# Patient Record
Sex: Female | Born: 1950 | Race: Black or African American | Hispanic: No | Marital: Single | Smoking: Never smoker
Health system: Southern US, Community
[De-identification: ages and names within clinical notes are randomized; demographics above are authoritative.]

## PROBLEM LIST (undated history)

## (undated) DIAGNOSIS — C801 Malignant (primary) neoplasm, unspecified: Secondary | ICD-10-CM

## (undated) DIAGNOSIS — I10 Essential (primary) hypertension: Secondary | ICD-10-CM

## (undated) HISTORY — PX: TUBAL LIGATION: SHX77

## (undated) HISTORY — PX: EYE SURGERY: SHX253

## (undated) HISTORY — PX: ABDOMINAL HYSTERECTOMY: SHX81

---

## 2009-04-02 ENCOUNTER — Emergency Department: Payer: Self-pay | Admitting: Emergency Medicine

## 2010-04-04 ENCOUNTER — Emergency Department: Payer: Self-pay | Admitting: Emergency Medicine

## 2012-08-09 ENCOUNTER — Ambulatory Visit: Payer: Self-pay | Admitting: Family Medicine

## 2013-08-14 ENCOUNTER — Ambulatory Visit: Payer: Self-pay | Admitting: Neurology

## 2013-12-24 ENCOUNTER — Encounter (INDEPENDENT_AMBULATORY_CARE_PROVIDER_SITE_OTHER): Payer: BC Managed Care – PPO | Admitting: Ophthalmology

## 2013-12-24 DIAGNOSIS — H35033 Hypertensive retinopathy, bilateral: Secondary | ICD-10-CM

## 2013-12-24 DIAGNOSIS — H59033 Cystoid macular edema following cataract surgery, bilateral: Secondary | ICD-10-CM

## 2013-12-24 DIAGNOSIS — I10 Essential (primary) hypertension: Secondary | ICD-10-CM

## 2013-12-24 DIAGNOSIS — H43813 Vitreous degeneration, bilateral: Secondary | ICD-10-CM

## 2015-04-25 ENCOUNTER — Ambulatory Visit (INDEPENDENT_AMBULATORY_CARE_PROVIDER_SITE_OTHER): Payer: Managed Care, Other (non HMO)

## 2015-04-25 ENCOUNTER — Encounter: Payer: Self-pay | Admitting: Gynecology

## 2015-04-25 ENCOUNTER — Ambulatory Visit
Admission: EM | Admit: 2015-04-25 | Discharge: 2015-04-25 | Disposition: A | Discharge status: Home / Self Care | Payer: Managed Care, Other (non HMO) | Attending: Family Medicine | Admitting: Family Medicine

## 2015-04-25 DIAGNOSIS — M791 Myalgia, unspecified site: Secondary | ICD-10-CM

## 2015-04-25 DIAGNOSIS — N390 Urinary tract infection, site not specified: Secondary | ICD-10-CM

## 2015-04-25 HISTORY — DX: Essential (primary) hypertension: I10

## 2015-04-25 HISTORY — DX: Malignant (primary) neoplasm, unspecified: C80.1

## 2015-04-25 LAB — URINALYSIS COMPLETE WITH MICROSCOPIC (ARMC ONLY)
BILIRUBIN URINE: NEGATIVE
Glucose, UA: NEGATIVE mg/dL
Hgb urine dipstick: NEGATIVE
NITRITE: NEGATIVE
PH: 6.5 (ref 5.0–8.0)
Protein, ur: NEGATIVE mg/dL
Specific Gravity, Urine: 1.025 (ref 1.005–1.030)

## 2015-04-25 MED ORDER — NITROFURANTOIN MONOHYD MACRO 100 MG PO CAPS: 100.0000 mg | ORAL_CAPSULE | Freq: Two times a day (BID) | ORAL | Status: AC

## 2015-04-25 NOTE — ED Notes (Signed)
Patient c/o upper back pain at her shoulder blade. Patient clo UTI with sx of lower abdomen pain.

## 2015-04-25 NOTE — ED Provider Notes (Addendum)
CSN: [id number]     Arrival date & time 04/25/15  1429 History   First MD Initiated Contact with Patient 04/25/15 1546     Chief Complaint  Patient presents with  . Back Pain  . Urinary Tract Infection   (Consider location/radiation/quality/duration/timing/severity/associated sxs/prior Treatment) HPI: Patient presents today with symptoms of left scapular pain for the last 4 days. Patient admits that the symptoms are when she takes a deep breath. She denies any trauma or injury to the area. She denies any chest pain. She denies any shortness of breath, fever, headache, diaphoresis, nausea, vomiting, severe abdominal pain, flank pain. She also has had some urinary frequency and mild dysuria for the last day. She has a history of having a hysterectomy last year. Patient has history of thoracic compression fracture in the past. Patient admits that she has been wearing a spanx-like outfit to help with belly bulge that goes up to the breasts. She has slept in this a few times.  Past Medical History  Diagnosis Date  . Hypertension   . Cancer [facility])    Past Surgical History  Procedure Laterality Date  . Abdominal hysterectomy    . Eye surgery    . Tubal ligation     No family history on file. Social History  Substance Use Topics  . Smoking status: Never Smoker   . Smokeless tobacco: None  . Alcohol Use: Yes   OB History    No data available     Review of Systems: Negative except mentioned above.  Allergies  Review of patient's allergies indicates no known allergies.  Home Medications   Prior to Admission medications   Medication Sig Start Date End Date Taking? Authorizing Provider  amLODipine (NORVASC) 5 MG tablet Take 5 mg by mouth daily.   Yes Historical Provider, MD  cetirizine (ZYRTEC) 10 MG tablet Take 10 mg by mouth daily.   Yes Historical Provider, MD  hydrochlorothiazide (HYDRODIURIL) 25 MG tablet Take 25 mg by mouth daily.   Yes Historical Provider, MD  lisinopril  (PRINIVIL,ZESTRIL) 40 MG tablet Take 40 mg by mouth daily.   Yes Historical Provider, MD   Meds Ordered and Administered this Visit  Medications - No data to display  BP 153/94 mmHg  Pulse 67  Temp(Src) 98 F (36.7 C) (Oral)  Resp 16  Ht 5\' 3"  (1.6 m)  Wt 138 lb (62.596 kg)  BMI 24.45 kg/m2  SpO2 100% No data found.   Physical Exam   GENERAL: NAD HEENT: no pharyngeal erythema, no exudate, no erythema of TMs, no cervical LAD RESP: CTA B CARD: RRR MSK: mild tenderness to palpation along left scapula, no rash noted over area, FROM of back and neck ABD: +BS, NT, no flank tenderness NEURO: CN II-XII grossly intact   ED Course  Procedures (including critical care time)  Labs Review Labs Reviewed  URINALYSIS COMPLETEWITH MICROSCOPIC ([facility] ONLY) - Abnormal; Notable for the following:    Ketones, ur TRACE (*)    Leukocytes, UA TRACE (*)    Bacteria, UA FEW (*)    Squamous Epithelial / LPF 0-5 (*)    All other components within normal limits  URINE CULTURE    Imaging Review No results found.    MDM  A/P: UTI, Muscle Pain- Will treat patient for UTI with Macrobid. Send urine for culture. If symptoms persist or worsen I do recommend the patient seek medical attention. Patient's chest x-ray only shows old thoracic compression fracture. Patient can take over-the-counter pain  medication for symptoms. Would recommend that patient try not wearing the spanx-like outfit in case this is contributing to her symptoms. I'm not certain that her old thoracic compression fracture is contributing to her symptoms but if there is a discogenic cause an MRI would be helpful to get. She'll follow up with her primary care physician regarding this.  If symptoms do persist or worsen I do recommend that she follow up with her primary care physician for further workup and treatment.    Paulina Fusi, MD 04/25/15 [id number]  Paulina Fusi, MD 04/25/15 [phone]

## 2015-04-27 LAB — URINE CULTURE

## 2015-04-29 ENCOUNTER — Telehealth: Payer: Self-pay | Admitting: *Deleted

## 2015-04-29 NOTE — ED Notes (Signed)
Called patient and she reported that her initial symptoms had improved, but she now has a yeast infection. Prescription for diflucan called into pharmacy on record.

## 2015-05-24 ENCOUNTER — Inpatient Hospital Stay
Admit: 2015-05-24 | Discharge: 2015-05-24 | Disposition: A | Discharge status: Home / Self Care | Payer: PRIVATE HEALTH INSURANCE | Attending: Emergency Medicine

## 2015-05-24 DIAGNOSIS — S39012A Strain of muscle, fascia and tendon of lower back, initial encounter: Secondary | ICD-10-CM

## 2015-05-24 MED ORDER — METHOCARBAMOL 750 MG TAB
750 mg | ORAL_TABLET | Freq: Three times a day (TID) | ORAL | 0 refills | Status: AC
Start: 2015-05-24 — End: ?

## 2015-05-24 MED ORDER — KETOROLAC TROMETHAMINE 15 MG/ML INJECTION
15 mg/mL | INTRAMUSCULAR | Status: AC
Start: 2015-05-24 — End: 2015-05-24
  Administered 2015-05-24: 17:00:00 via INTRAMUSCULAR

## 2015-05-24 MED ORDER — NAPROXEN 500 MG TAB
500 mg | ORAL_TABLET | Freq: Two times a day (BID) | ORAL | 0 refills | Status: AC
Start: 2015-05-24 — End: 2015-06-03

## 2015-05-24 MED FILL — KETOROLAC TROMETHAMINE 15 MG/ML INJECTION: 15 mg/mL | INTRAMUSCULAR | Qty: 1

## 2015-05-24 NOTE — ED Notes (Signed)
Discharge instructions reviewed with the patient with opportunity for questions given.  The patient verbalized understanding. Patient armband removed and shredded. Patient in stable condition at time of discharge.

## 2015-05-24 NOTE — ED Notes (Signed)
This RN entered room to medicate pt. Pt states, "Before you medicate me, I need some answers! You all haven't done any X-Rays, my legs are still spasming, my face still tingles and I need an examination because I have to drive back to [place] and I could have nerve damage!" Bet[place], PA to bedside to speak with pt. Family at bedside. Pt updated on plan of care, verbalizes understanding.
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
## 2016-09-28 IMAGING — CR DG CHEST 2V
2 series · 2 of 2 positions shown · non-contrast
Comparison: 04/05/2010 portable AP view only

CLINICAL DATA: Left chest and scapular pain for 4 days. No known
injury.

EXAM:
CHEST  2 VIEW

[chest pa]
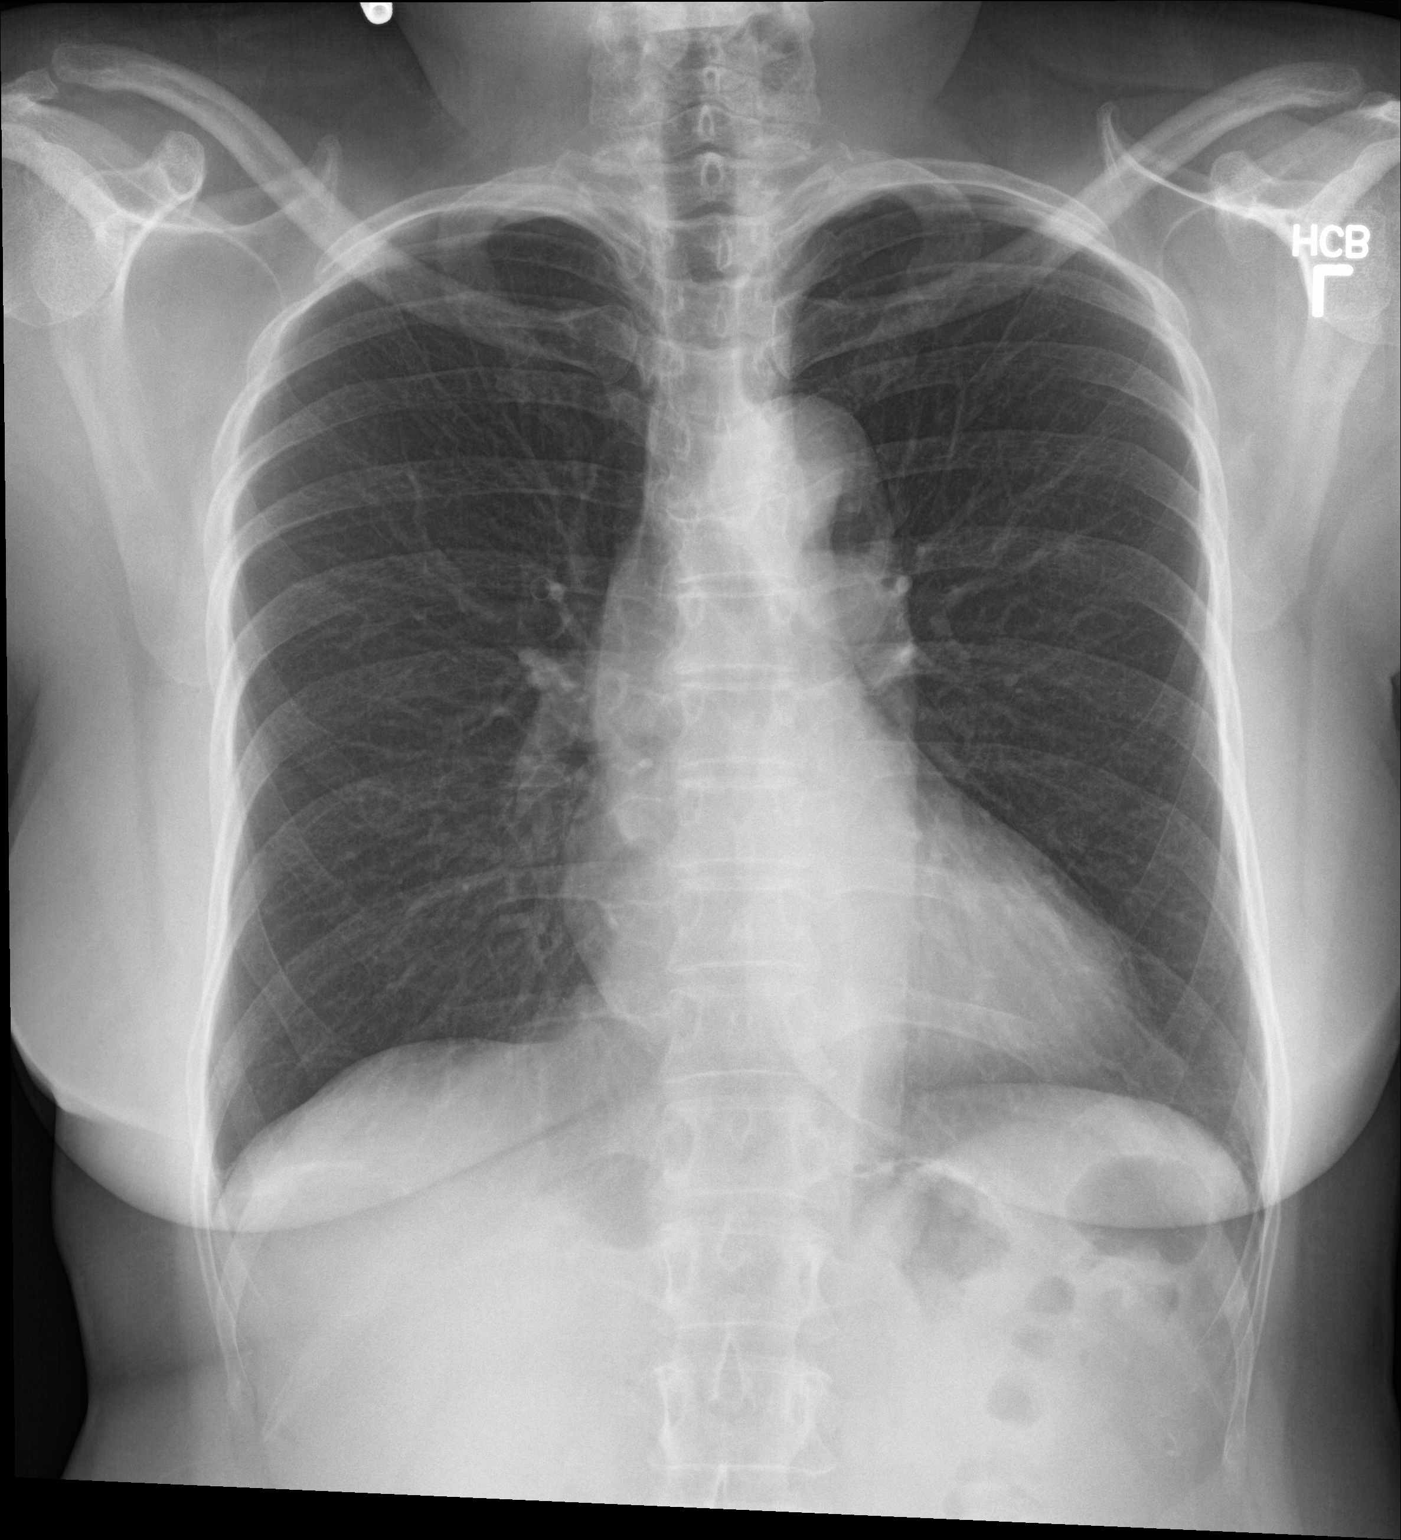

[chest lat]
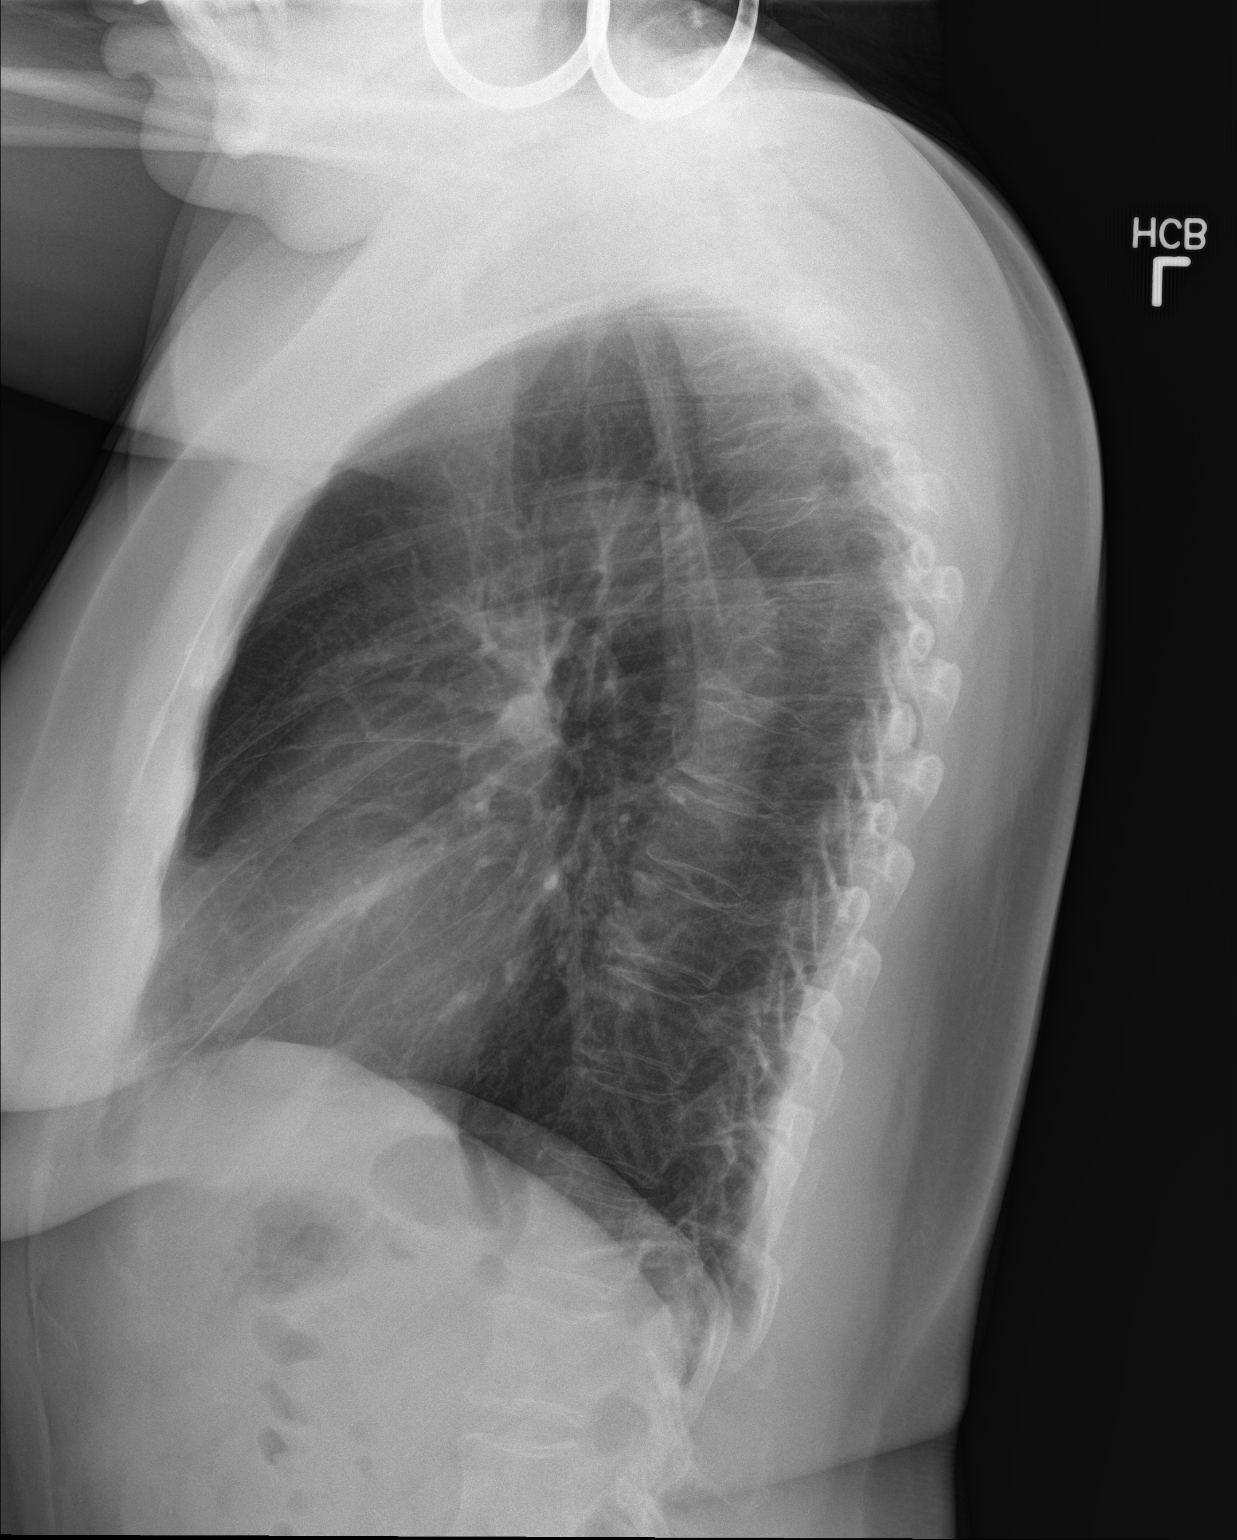

[2 of 2 positions shown; findings below may reference images not displayed]

FINDINGS: The heart size and mediastinal contours are within normal limits.
Both lungs are clear. No evidence of pneumothorax or pleural
effusion. Upper thoracic vertebral body compression fracture
deformity is seen, which appears to be present on previous AP
portable exam.
IMPRESSION: No active cardiopulmonary disease.

Upper thoracic vertebral body compression fracture deformity, which
is likely old. Recommend clinical correlation, and consider thoracic
spine CT for further evaluation if clinically warranted.

## 2020-02-17 LAB — EXTERNAL GENERIC LAB PROCEDURE: COLOGUARD: NEGATIVE

## 2023-02-12 ENCOUNTER — Emergency Department: Payer: Self-pay

## 2023-02-12 ENCOUNTER — Other Ambulatory Visit: Payer: Self-pay

## 2023-02-12 ENCOUNTER — Encounter: Payer: Self-pay | Admitting: Emergency Medicine

## 2023-02-12 ENCOUNTER — Emergency Department
Admission: EM | Admit: 2023-02-12 | Discharge: 2023-02-12 | Disposition: A | Discharge status: Home / Self Care | Payer: 59 | Attending: Emergency Medicine | Admitting: Emergency Medicine

## 2023-02-12 DIAGNOSIS — S0990XA Unspecified injury of head, initial encounter: Secondary | ICD-10-CM | POA: Insufficient documentation

## 2023-02-12 DIAGNOSIS — S39012A Strain of muscle, fascia and tendon of lower back, initial encounter: Secondary | ICD-10-CM | POA: Diagnosis not present

## 2023-02-12 DIAGNOSIS — S93401A Sprain of unspecified ligament of right ankle, initial encounter: Secondary | ICD-10-CM | POA: Insufficient documentation

## 2023-02-12 DIAGNOSIS — Y92410 Unspecified street and highway as the place of occurrence of the external cause: Secondary | ICD-10-CM | POA: Insufficient documentation

## 2023-02-12 DIAGNOSIS — S199XXA Unspecified injury of neck, initial encounter: Secondary | ICD-10-CM | POA: Diagnosis present

## 2023-02-12 DIAGNOSIS — S299XXA Unspecified injury of thorax, initial encounter: Secondary | ICD-10-CM | POA: Insufficient documentation

## 2023-02-12 DIAGNOSIS — S3991XA Unspecified injury of abdomen, initial encounter: Secondary | ICD-10-CM | POA: Diagnosis not present

## 2023-02-12 DIAGNOSIS — S161XXA Strain of muscle, fascia and tendon at neck level, initial encounter: Secondary | ICD-10-CM | POA: Insufficient documentation

## 2023-02-12 LAB — BASIC METABOLIC PANEL
Anion gap: 8 (ref 5–15)
BUN: 18 mg/dL (ref 8–23)
CO2: 27 mmol/L (ref 22–32)
Calcium: 9.6 mg/dL (ref 8.9–10.3)
Chloride: 104 mmol/L (ref 98–111)
Creatinine, Ser: 1.09 mg/dL — ABNORMAL HIGH (ref 0.44–1.00)
GFR, Estimated: 54 mL/min — ABNORMAL LOW (ref >60)
Glucose, Bld: 136 mg/dL — ABNORMAL HIGH (ref 70–99)
Potassium: 3.3 mmol/L — ABNORMAL LOW (ref 3.5–5.1)
Sodium: 139 mmol/L (ref 135–145)

## 2023-02-12 LAB — CBC WITH DIFFERENTIAL/PLATELET
Abs Immature Granulocytes: 0 10*3/uL (ref 0.00–0.07)
Basophils Absolute: 0 10*3/uL (ref 0.0–0.1)
Basophils Relative: 0 %
Eosinophils Absolute: 0.1 10*3/uL (ref 0.0–0.5)
Eosinophils Relative: 2 %
HCT: 43 % (ref 36.0–46.0)
Hemoglobin: 14.2 g/dL (ref 12.0–15.0)
Immature Granulocytes: 0 %
Lymphocytes Relative: 42 %
Lymphs Abs: 2.4 10*3/uL (ref 0.7–4.0)
MCH: 29.7 pg (ref 26.0–34.0)
MCHC: 33 g/dL (ref 30.0–36.0)
MCV: 90 fL (ref 80.0–100.0)
Monocytes Absolute: 0.4 10*3/uL (ref 0.1–1.0)
Monocytes Relative: 7 %
Neutro Abs: 2.8 10*3/uL (ref 1.7–7.7)
Neutrophils Relative %: 49 %
Platelets: 177 10*3/uL (ref 150–400)
RBC: 4.78 MIL/uL (ref 3.87–5.11)
RDW: 13.2 % (ref 11.5–15.5)
WBC: 5.8 10*3/uL (ref 4.0–10.5)
nRBC: 0 % (ref 0.0–0.2)

## 2023-02-12 MED ORDER — ACETAMINOPHEN 500 MG PO TABS
1000.0000 mg | ORAL_TABLET | Freq: Once | ORAL | Status: AC
  Administered 2023-02-12: 1000 mg via ORAL
  Filled 2023-02-12: qty 2

## 2023-02-12 MED ORDER — OXYCODONE HCL 5 MG PO TABS
5.0000 mg | ORAL_TABLET | Freq: Once | ORAL | Status: AC
  Administered 2023-02-12: 5 mg via ORAL
  Filled 2023-02-12: qty 1

## 2023-02-12 MED ORDER — IOHEXOL 300 MG/ML  SOLN
80.0000 mL | Freq: Once | INTRAMUSCULAR | Status: AC | PRN
  Administered 2023-02-12: 80 mL via INTRAVENOUS

## 2023-02-12 MED ORDER — MUSCLE RUB 10-15 % EX CREA: 1.0000 | TOPICAL_CREAM | CUTANEOUS | 0 refills | Status: AC | PRN

## 2023-02-12 MED ORDER — IBUPROFEN 600 MG PO TABS
600.0000 mg | ORAL_TABLET | Freq: Once | ORAL | Status: AC
  Administered 2023-02-12: 600 mg via ORAL
  Filled 2023-02-12: qty 1
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
## 2023-03-13 ENCOUNTER — Other Ambulatory Visit: Payer: Self-pay

## 2023-03-13 ENCOUNTER — Emergency Department: Payer: Medicare Other

## 2023-03-13 ENCOUNTER — Emergency Department
Admission: EM | Admit: 2023-03-13 | Discharge: 2023-03-13 | Disposition: A | Discharge status: Home / Self Care | Payer: Medicare Other | Attending: Emergency Medicine | Admitting: Emergency Medicine

## 2023-03-13 DIAGNOSIS — I10 Essential (primary) hypertension: Secondary | ICD-10-CM | POA: Insufficient documentation

## 2023-03-13 LAB — TROPONIN I (HIGH SENSITIVITY): Troponin I (High Sensitivity): 7 ng/L (ref <18)

## 2023-03-13 LAB — BASIC METABOLIC PANEL
Anion gap: 11 (ref 5–15)
BUN: 18 mg/dL (ref 8–23)
CO2: 23 mmol/L (ref 22–32)
Calcium: 9.5 mg/dL (ref 8.9–10.3)
Chloride: 105 mmol/L (ref 98–111)
Creatinine, Ser: 0.95 mg/dL (ref 0.44–1.00)
GFR, Estimated: >60 mL/min (ref >60)
Glucose, Bld: 144 mg/dL — ABNORMAL HIGH (ref 70–99)
Potassium: 4.2 mmol/L (ref 3.5–5.1)
Sodium: 139 mmol/L (ref 135–145)

## 2023-03-13 LAB — CBC
HCT: 43.7 % (ref 36.0–46.0)
Hemoglobin: 14.7 g/dL (ref 12.0–15.0)
MCH: 29.7 pg (ref 26.0–34.0)
MCHC: 33.6 g/dL (ref 30.0–36.0)
MCV: 88.3 fL (ref 80.0–100.0)
Platelets: 203 10*3/uL (ref 150–400)
RBC: 4.95 MIL/uL (ref 3.87–5.11)
RDW: 13.4 % (ref 11.5–15.5)
WBC: 6.1 10*3/uL (ref 4.0–10.5)
nRBC: 0 % (ref 0.0–0.2)

## 2023-03-13 MED ORDER — VALSARTAN-HYDROCHLOROTHIAZIDE 320-25 MG PO TABS: 1.0000 | ORAL_TABLET | Freq: Every day | ORAL | 1 refills | Status: AC

## 2023-03-13 MED ORDER — HYDROCHLOROTHIAZIDE 25 MG PO TABS
25.0000 mg | ORAL_TABLET | Freq: Once | ORAL | Status: AC
  Administered 2023-03-13: 25 mg via ORAL
  Filled 2023-03-13: qty 1
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
## 2023-03-13 NOTE — ED Triage Notes (Signed)
First Nurse Note: Patient to ED from [facility] for high BP. Initially being since for sore throat and back pain. In the 200's at [facility].
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
## 2023-03-13 NOTE — ED Notes (Signed)
 Patient discharged from ED by provider. Discharge instructions reviewed with patient and all questions answered. Patient ambulatory from ED in NAD.

## 2023-03-13 NOTE — Discharge Instructions (Signed)
